# Patient Record
Sex: Male | Born: 1960 | Race: Black or African American | Hispanic: No | Marital: Single | Smoking: Current every day smoker
Health system: Southern US, Community
[De-identification: ages and names within clinical notes are randomized; demographics above are authoritative.]

---

## 1999-06-16 ENCOUNTER — Emergency Department (HOSPITAL_COMMUNITY): Admission: EM | Admit: 1999-06-16 | Discharge: 1999-06-16 | Payer: Self-pay | Admitting: Emergency Medicine

## 1999-07-22 ENCOUNTER — Emergency Department (HOSPITAL_COMMUNITY): Admission: EM | Admit: 1999-07-22 | Discharge: 1999-07-22 | Payer: Self-pay | Admitting: Emergency Medicine

## 2002-05-19 ENCOUNTER — Emergency Department (HOSPITAL_COMMUNITY): Admission: EM | Admit: 2002-05-19 | Discharge: 2002-05-19 | Payer: Self-pay

## 2003-12-30 ENCOUNTER — Emergency Department (HOSPITAL_COMMUNITY): Admission: EM | Admit: 2003-12-30 | Discharge: 2003-12-30 | Payer: Self-pay | Admitting: *Deleted

## 2004-01-07 ENCOUNTER — Emergency Department (HOSPITAL_COMMUNITY): Admission: EM | Admit: 2004-01-07 | Discharge: 2004-01-07 | Payer: Self-pay | Admitting: Family Medicine

## 2004-06-12 ENCOUNTER — Ambulatory Visit: Payer: Self-pay | Admitting: Internal Medicine

## 2004-06-18 ENCOUNTER — Emergency Department (HOSPITAL_COMMUNITY): Admission: EM | Admit: 2004-06-18 | Discharge: 2004-06-18 | Payer: Self-pay | Admitting: Emergency Medicine

## 2005-03-28 ENCOUNTER — Ambulatory Visit: Payer: Self-pay | Admitting: Internal Medicine

## 2005-05-07 ENCOUNTER — Ambulatory Visit: Payer: Self-pay | Admitting: Internal Medicine

## 2005-06-11 ENCOUNTER — Ambulatory Visit: Payer: Self-pay | Admitting: Internal Medicine

## 2005-06-25 ENCOUNTER — Ambulatory Visit (HOSPITAL_COMMUNITY): Admission: RE | Admit: 2005-06-25 | Discharge: 2005-06-25 | Payer: Self-pay | Admitting: Internal Medicine

## 2005-06-25 ENCOUNTER — Encounter (INDEPENDENT_AMBULATORY_CARE_PROVIDER_SITE_OTHER): Payer: Self-pay | Admitting: Specialist

## 2005-06-28 ENCOUNTER — Ambulatory Visit: Payer: Self-pay | Admitting: Internal Medicine

## 2005-07-02 ENCOUNTER — Emergency Department (HOSPITAL_COMMUNITY): Admission: EM | Admit: 2005-07-02 | Discharge: 2005-07-02 | Payer: Self-pay | Admitting: Emergency Medicine

## 2005-07-02 ENCOUNTER — Ambulatory Visit: Payer: Self-pay | Admitting: Internal Medicine

## 2005-10-22 ENCOUNTER — Ambulatory Visit: Payer: Self-pay | Admitting: Internal Medicine

## 2006-04-15 ENCOUNTER — Ambulatory Visit: Payer: Self-pay | Admitting: Internal Medicine

## 2006-05-13 ENCOUNTER — Ambulatory Visit: Payer: Self-pay | Admitting: Pulmonary Disease

## 2006-06-16 ENCOUNTER — Ambulatory Visit (HOSPITAL_BASED_OUTPATIENT_CLINIC_OR_DEPARTMENT_OTHER): Admission: RE | Admit: 2006-06-16 | Discharge: 2006-06-16 | Payer: Self-pay | Admitting: Pulmonary Disease

## 2006-06-16 ENCOUNTER — Ambulatory Visit: Payer: Self-pay | Admitting: Pulmonary Disease

## 2006-10-11 ENCOUNTER — Encounter: Payer: Self-pay | Admitting: Internal Medicine

## 2006-10-11 DIAGNOSIS — F528 Other sexual dysfunction not due to a substance or known physiological condition: Secondary | ICD-10-CM

## 2006-10-11 DIAGNOSIS — F1021 Alcohol dependence, in remission: Secondary | ICD-10-CM

## 2006-10-11 DIAGNOSIS — K603 Anal fistula: Secondary | ICD-10-CM

## 2006-12-09 ENCOUNTER — Ambulatory Visit: Payer: Self-pay | Admitting: Internal Medicine

## 2006-12-09 ENCOUNTER — Encounter: Payer: Self-pay | Admitting: Internal Medicine

## 2006-12-09 DIAGNOSIS — G4733 Obstructive sleep apnea (adult) (pediatric): Secondary | ICD-10-CM | POA: Insufficient documentation

## 2006-12-09 LAB — CONVERTED CEMR LAB
ALT: 22 units/L (ref 0–53)
AST: 17 units/L (ref 0–37)
Albumin: 3.9 g/dL (ref 3.5–5.2)
Alkaline Phosphatase: 50 units/L (ref 39–117)
BUN: 10 mg/dL (ref 6–23)
Basophils Absolute: 0 10*3/uL (ref 0.0–0.1)
Basophils Relative: 0.3 % (ref 0.0–1.0)
Bilirubin Urine: NEGATIVE
Bilirubin, Direct: 0.1 mg/dL (ref 0.0–0.3)
CO2: 31 meq/L (ref 19–32)
Calcium: 9.5 mg/dL (ref 8.4–10.5)
Chloride: 108 meq/L (ref 96–112)
Cholesterol: 152 mg/dL (ref 0–200)
Creatinine, Ser: 1 mg/dL (ref 0.4–1.5)
Eosinophils Absolute: 0.2 10*3/uL (ref 0.0–0.6)
Eosinophils Relative: 2.4 % (ref 0.0–5.0)
GFR calc Af Amer: 104 mL/min
GFR calc non Af Amer: 86 mL/min
Glucose, Bld: 113 mg/dL — ABNORMAL HIGH (ref 70–99)
HCT: 45.2 % (ref 39.0–52.0)
HDL: 34.3 mg/dL — ABNORMAL LOW (ref >39.0)
Hemoglobin: 14.8 g/dL (ref 13.0–17.0)
Ketones, ur: NEGATIVE mg/dL
LDL Cholesterol: 84 mg/dL (ref 0–99)
Leukocytes, UA: NEGATIVE
Lymphocytes Relative: 37.7 % (ref 12.0–46.0)
MCHC: 32.7 g/dL (ref 30.0–36.0)
MCV: 78.2 fL (ref 78.0–100.0)
Monocytes Absolute: 0.5 10*3/uL (ref 0.2–0.7)
Monocytes Relative: 6 % (ref 3.0–11.0)
Neutro Abs: 5 10*3/uL (ref 1.4–7.7)
Neutrophils Relative %: 53.6 % (ref 43.0–77.0)
Nitrite: NEGATIVE
PSA: 1.17 ng/mL (ref 0.10–4.00)
Platelets: 224 10*3/uL (ref 150–400)
Potassium: 4.8 meq/L (ref 3.5–5.1)
RBC: 5.78 M/uL (ref 4.22–5.81)
RDW: 15.5 % — ABNORMAL HIGH (ref 11.5–14.6)
Sodium: 144 meq/L (ref 135–145)
Specific Gravity, Urine: 1.02 (ref 1.000–1.03)
TSH: 0.67 microintl units/mL (ref 0.35–5.50)
Total Bilirubin: 0.6 mg/dL (ref 0.3–1.2)
Total CHOL/HDL Ratio: 4.4
Total Protein, Urine: NEGATIVE mg/dL
Total Protein: 7.1 g/dL (ref 6.0–8.3)
Triglycerides: 168 mg/dL — ABNORMAL HIGH (ref 0–149)
Urine Glucose: NEGATIVE mg/dL
Urobilinogen, UA: 0.2 (ref 0.0–1.0)
VLDL: 34 mg/dL (ref 0–40)
WBC: 9.1 10*3/uL (ref 4.5–10.5)
pH: 6 (ref 5.0–8.0)

## 2006-12-13 ENCOUNTER — Ambulatory Visit: Payer: Self-pay | Admitting: Internal Medicine

## 2006-12-13 ENCOUNTER — Telehealth: Payer: Self-pay | Admitting: Internal Medicine

## 2006-12-13 LAB — CONVERTED CEMR LAB: HIV: REACTIVE

## 2006-12-16 LAB — CONVERTED CEMR LAB

## 2006-12-19 ENCOUNTER — Telehealth (INDEPENDENT_AMBULATORY_CARE_PROVIDER_SITE_OTHER): Payer: Self-pay | Admitting: *Deleted

## 2007-01-17 ENCOUNTER — Ambulatory Visit: Payer: Self-pay | Admitting: Internal Medicine

## 2007-01-18 LAB — CONVERTED CEMR LAB

## 2007-01-22 ENCOUNTER — Telehealth (INDEPENDENT_AMBULATORY_CARE_PROVIDER_SITE_OTHER): Payer: Self-pay | Admitting: *Deleted

## 2007-10-27 ENCOUNTER — Ambulatory Visit: Payer: Self-pay | Admitting: Internal Medicine

## 2007-10-27 DIAGNOSIS — R030 Elevated blood-pressure reading, without diagnosis of hypertension: Secondary | ICD-10-CM | POA: Insufficient documentation

## 2007-10-27 DIAGNOSIS — H60339 Swimmer's ear, unspecified ear: Secondary | ICD-10-CM | POA: Insufficient documentation

## 2008-01-12 ENCOUNTER — Ambulatory Visit: Payer: Self-pay | Admitting: Endocrinology

## 2010-06-30 NOTE — Assessment & Plan Note (Signed)
[facility]                           GASTROENTEROLOGY OFFICE NOTE   NAME:Bartlett, Eric MUZYKA                   MRN:          [id number]  DATE:10/22/2005                            DOB:          Dec 08, 1960    REASON FOR VISIT/CHIEF COMPLAINT:  Recurrent rectal pain.   ASSESSMENT:  1. Recurrent anal fissure, much improved with stool softeners.  2. Morbid obesity with snoring, he is at risk for sleep apnea though this      is not proven.  3. Prior colonoscopy Jun 25, 2005, demonstrating possible ulceration,      pathology showed focal erosion, I thought this was probably from scope      trauma.   RECOMMENDATIONS:  1. Nitroglycerin 0.2% ointment is prescribed for the patient to use on his      recurrent fissure symptoms.  He is actually better with conservative      therapy.  2. If persistent problems, we could consider a Botox injection, he saw Dr.      Jamey Ripa who thought that his morbid obesity and risk for sleep apnea      warranted formal sleep apnea evaluation before any general anesthesia      for fissure treatment.  Fortunately, he is better with conservative      therapy.  Note, I had tried Diltiazem therapy and it sounds like the      nitroglycerin works better and he seems to be adapting to any headache.   He should follow up with Dr. Jonny Ruiz regarding general medical care.  He is  trying to lose weight and is commended.  A sleep apnea evaluation might be  reasonable.  He does have a fair amount of snoring, although I do not get a  sense for fatigue or headaches during the day.  He is certainly at risk for  this.  I will see him back as needed at this point.  Clearly if he has  persistent symptoms, we will not want to refill his medications without an  evaluation.  On rectal exam today, he was really nontender around the anus,  digital exam was not completely performed, however.  See my medical history  and physical for full details.                                   Iva Boop, MD,FACG   CEG/MedQ  DD:  10/22/2005  DT:  10/23/2005  Job #:  [id number]   cc:   Currie Paris, M.D.  Dr. Jonny Ruiz

## 2010-06-30 NOTE — Assessment & Plan Note (Signed)
[facility]                             PULMONARY OFFICE NOTE   NAME:Eric Bartlett, Eric Bartlett                   MRN:          [id number]  DATE:05/13/2006                            DOB:          08-07-1960    Eric Bartlett was seen today for evaluation of his sleep difficulties.   He works as a Naval architect and during his evaluation by the Department  of Transportation due to his weight and his neck size, there was concern  that he may have sleep disorder breathing and as a result is he referred  for further evaluation.  He says that he is on the road Monday through  Friday and his driving hours can vary, although he says he will always  allow himself at least 7 to 8 hours of rest time.  However, when he is  at home, on weekends he will typically go to bed about 1 in the morning,  he wakes up at 6 in the morning.  He says he is a fairly restless  sleeper, he has been told that he snores and has been told he has stops  breathing while he is asleep.  He will sometimes wake up hearing himself  snore.  He has difficulty sleeping on his back. He does drool at night  and he says that he rarely has any dreams.  He has had episodes where he  has woke up with a headache.  He denies any symptoms of restless leg  syndrome, there is no history of sleep hallucinations, sleep paralysis,  cataplexy.  He denies any history of sleep walking, sleep talking or  bruxism.  He is not currently using anything to help him fall asleep at  night.  He says he will drink an energy drink when he has longer drives,  otherwise he drinks 2 to 3 large cups of coffee on a regular basis.  He  says he can fall asleep when he is watching TV, depending upon what is  on.  He says he has not had any problems as far as getting into any  accidents while driving.   His past medical history otherwise is significant for erectile  dysfunction and anal fissure.   CURRENT MEDICATIONS:  Stool softener and  fiber supplement.   He has no known drug allergies.   SOCIAL HISTORY:  He is separated.  He works with [employer].  He smokes 1 pack of cigarettes a day for the last 20 years.  He quit drinking 10-1/2 years ago.  He quit using drugs 10-1/2 years  ago.   FAMILY HISTORY:  Is significant for his father who is an alcoholic and  his brother who had drug abuse and schizophrenia.   REVIEW OF SYSTEMS:  He has gained approximately 10 pounds over about 2  years and he has leg swelling at the end of the day.   PHYSICAL EXAMINATION:  He is 6 feet 4 inches tall, 350 pounds.  Temperature is 98.  Blood pressure is 160/110.  Heart rate 77.  Oxygen  saturation 99% on room air.  HEENT:  Pupils are reactive. There is no sinus tenderness, no nasal  discharge.  He has a Mallampati 4 airway. There is no lymphadenopathy.  HEART:  With S1, S2.  CHEST:  Was clear on auscultation.  ABDOMEN:  Was obese, soft, nontender.  EXTREMITIES:  No edema, cyanosis, clubbing.  NEURO:  No focal deficits were appreciated.   IMPRESSION:  1. He certainly has symptoms on physical findings which are concerning      for obstructive sleep apnea. I reviewed this diagnosis with him.  I      had also discussed with him the adverse consequences of untreated      sleep apnea, including increased risk of hypertension, coronary      artery disease, cerebrovascular disease and diabetes.  I discussed      with him the importance of diet, exercise, weight reduction as well      as the avoidance of alcohol and sedatives.  Driving precautions      were discussed with him that if the fact he is found to have sleep      disordered breathing, this would have impact on his occupation.  To      further evaluate this I have scheduled him for an overnight      polysomnogram, then I would discuss the results with him once they      are available.  2. Elevated blood pressure in the office.  He is to have follow-up       with Dr. Jonny Ruiz for further evaluation of this.  3. Tobacco use.  I have discussed with him several techniques with      regards to smoking cessation and he is to have further evaluation      of this with Dr. Jonny Ruiz.  4. Morbid obesity.  I again have discussed with him the importance of      diet and exercise and weight reduction and he will also have follow      up with Dr. Jonny Ruiz for this.     Coralyn Helling, MD  Electronically Signed    VS/MedQ  DD: 05/13/2006  DT: 05/13/2006  Job #: [id number]   cc:   Corwin Levins, MD

## 2010-06-30 NOTE — Procedures (Signed)
NAME:  Eric Bartlett, Eric Bartlett NO.:  [id number]   MEDICAL RECORD NO.:  [id number]          PATIENT TYPE:  OUT   LOCATION:  SLEEP CENTER                 FACILITY:  [facility]   PHYSICIAN:  Coralyn Helling, MD        DATE OF BIRTH:  1960/06/29   DATE OF STUDY:  06/16/2006                            NOCTURNAL POLYSOMNOGRAM   REFERRING PHYSICIAN:  Coralyn Helling, MD   FACILITY:  [facility].   INDICATION:  This is an individual who has a history of excessive  daytime sleepiness with sleep disruption, and during evaluation by the  Department of Transportation, it was recommended he undergo further  evaluation for hypersomnia with obstructive sleep apnea.   EPWORTH SCORE:  The Epworth score is 4.   MEDICATIONS:  None.   SLEEP ARCHITECTURE:  Total recording time was 403 minutes.  Total sleep  time was 355 minutes.  Sleep efficiency was 88%.  Sleep latency was 11.5  minutes.  REM latency was 119 minutes.  This study was notable for the  lack of slow wave sleep and an increase in the percentage of REM sleep  to 37% of the study.  The patient slept both in the supine and non-  supine position.   RESPIRATORY DATA:  The average respiratory rate was 24.  The patient  followed the split night study protocol.  During the diagnostic portion  of the study, the apnea-hypopnea index was found to be 63.  The events  were exclusively obstructive in nature, and loud snoring was noted by  the technician.  During the therapeutic portion of the study, the  patient was titrated from a CPAP pressure setting of 6 to 16 cm H2O.  At  a CPAP pressure setting of 15 cm H2O, the apnea-hypopnea index was  reduced to 4.4.  At this pressure setting, the patient was observed in  both REM sleep and supine sleep, and snoring was eliminated.   OXYGEN DATA:  The baseline oxygenation was 96%.  The oxygen saturation  nadir was 79%.  At a CPAP pressure setting of 15 cm H2O, the mean  oxygenation during non-REM  sleep was 95%, the mean oxygenation during  REM sleep was 95%, the minimal oxygenation during non-REM sleep was 92%,  and the minimal oxygenation during REM sleep was 92%.   CARDIAC DATA:  The average heart rate as 71, and the rhythm strip showed  normal sinus rhythm.   MOVEMENT PARASOMNIA:  The periodic limb movement index was 0.   IMPRESSION:  This study shows evidence for severe obstructive sleep  apnea as demonstrated by an apnea-hypopnea index of 63 and an oxygen  saturation nadir of 79%.  During the therapeutic portion of the study,  he was  titrated to a CPAP pressure setting of 15 cm H2O with a reduction in his  apnea-hypopnea index to 4.4.  At this pressure setting, snoring was  eliminated, and he was observed in both REM sleep and supine sleep.      Coralyn Helling, MD  Diplomat, American Board of Sleep Medicine  Electronically Signed     VS/MEDQ  D:  06/18/2006 09:05:26  T:  06/18/2006 10:08:05  Job:  [id number]

## 2012-11-09 ENCOUNTER — Encounter (HOSPITAL_COMMUNITY): Payer: Self-pay | Admitting: Emergency Medicine

## 2012-11-09 ENCOUNTER — Emergency Department (HOSPITAL_COMMUNITY)
Admission: EM | Admit: 2012-11-09 | Discharge: 2012-11-09 | Disposition: A | Discharge status: Home / Self Care | Payer: Self-pay | Attending: Emergency Medicine | Admitting: Emergency Medicine

## 2012-11-09 DIAGNOSIS — F17200 Nicotine dependence, unspecified, uncomplicated: Secondary | ICD-10-CM | POA: Insufficient documentation

## 2012-11-09 DIAGNOSIS — H9190 Unspecified hearing loss, unspecified ear: Secondary | ICD-10-CM | POA: Insufficient documentation

## 2012-11-09 DIAGNOSIS — H6092 Unspecified otitis externa, left ear: Secondary | ICD-10-CM

## 2012-11-09 DIAGNOSIS — H9210 Otorrhea, unspecified ear: Secondary | ICD-10-CM | POA: Insufficient documentation

## 2012-11-09 DIAGNOSIS — H60399 Other infective otitis externa, unspecified ear: Secondary | ICD-10-CM | POA: Insufficient documentation

## 2012-11-09 DIAGNOSIS — R739 Hyperglycemia, unspecified: Secondary | ICD-10-CM

## 2012-11-09 DIAGNOSIS — R7309 Other abnormal glucose: Secondary | ICD-10-CM | POA: Insufficient documentation

## 2012-11-09 LAB — GLUCOSE, CAPILLARY: Glucose-Capillary: 112 mg/dL — ABNORMAL HIGH (ref 70–99)

## 2012-11-09 MED ORDER — HYDROCODONE-ACETAMINOPHEN 5-325 MG PO TABS: 1.0000 | ORAL_TABLET | ORAL | Status: DC | PRN

## 2012-11-09 MED ORDER — CIPROFLOXACIN-DEXAMETHASONE 0.3-0.1 % OT SUSP
4.0000 [drp] | Freq: Two times a day (BID) | OTIC | Status: AC
  Administered 2012-11-09: 4 [drp] via OTIC
  Filled 2012-11-09: qty 7.5

## 2012-11-09 NOTE — ED Provider Notes (Signed)
CSN: [id number]     Arrival date & time 11/09/12  4098 History   First MD Initiated Contact with Patient 11/09/12 [phone]     Chief Complaint  Patient presents with  . left ear pain    (Consider location/radiation/quality/duration/timing/severity/associated sxs/prior Treatment) Patient is a 52 y.o. male presenting with ear pain. The history is provided by the patient.  Otalgia Location:  Left Quality:  Throbbing, pressure and sore Severity:  Severe Onset quality:  Gradual Duration:  1 day Timing:  Constant Progression:  Worsening Chronicity:  Recurrent Relieved by:  None tried Worsened by:  Nothing tried Associated symptoms: ear discharge   Associated symptoms: no abdominal pain, no congestion, no cough, no diarrhea, no fever, no headaches, no neck pain, no rash, no rhinorrhea, no sore throat and no vomiting  Hearing loss: decreased hearing.       History reviewed. No pertinent past medical history. History reviewed. No pertinent past surgical history. No family history on file. History  Substance Use Topics  . Smoking status: Current Every Day Smoker  . Smokeless tobacco: Not on file  . Alcohol Use: No    Review of Systems  Constitutional: Negative for fever.  HENT: Positive for ear pain and ear discharge. Negative for congestion, sore throat, rhinorrhea, neck pain and neck stiffness. Hearing loss: decreased hearing.   Respiratory: Negative for cough.   Gastrointestinal: Negative for vomiting, abdominal pain and diarrhea.  Skin: Negative for rash.  Neurological: Negative for headaches.    Allergies  Review of patient's allergies indicates no known allergies.  Home Medications   Current Outpatient Rx  Name  Route  Sig  Dispense  Refill  . ibuprofen (ADVIL,MOTRIN) 200 MG tablet   Oral   Take 800 mg by mouth every 8 (eight) hours as needed for pain.         . sildenafil (VIAGRA) 100 MG tablet   Oral   Take 100 mg by mouth daily as needed for erectile  dysfunction.          BP 145/95  Pulse 63  Temp(Src) 98.3 F (36.8 C) (Oral)  Resp 18  SpO2 100% Physical Exam  Nursing note and vitals reviewed. Constitutional: He appears well-developed and well-nourished. No distress.  HENT:  Head: Normocephalic and atraumatic.  Right Ear: Hearing, tympanic membrane and ear canal normal. No drainage, swelling or tenderness. No mastoid tenderness. No decreased hearing is noted.  Left Ear: There is drainage, swelling and tenderness. No foreign bodies. No mastoid tenderness. Decreased hearing is noted.  Eyes: Conjunctivae are normal. No scleral icterus.  Neck: Normal range of motion. Neck supple.  Cardiovascular: Normal rate, regular rhythm and normal heart sounds.   Pulmonary/Chest: Effort normal and breath sounds normal. No respiratory distress.  Abdominal: Soft. There is no tenderness.  Musculoskeletal: He exhibits no edema.  Neurological: He is alert.  Skin: Skin is warm and dry. He is not diaphoretic.  Psychiatric: His behavior is normal.    ED Course  Procedures (including critical care time) Labs Review Labs Reviewed - No data to display Imaging Review No results found.  MDM  No diagnosis found. Patient with Otitis externa.  Draining No exposure to water. Last ear infections was 20 years ago. Patient is obeses and uninsured. Will check his CBG. Ear wick placed and ciprodex ordered.   CBG shows mild hyperglycemia  F/u w/ pcp and ENT   Arthor Captain, PA-C 11/09/12 1107

## 2012-11-09 NOTE — ED Notes (Signed)
Per pt, left ear pain since Friday-increased pain yesterday-slight drainage last night

## 2012-11-09 NOTE — ED Provider Notes (Signed)
Medical screening examination/treatment/procedure(s) were performed by non-physician practitioner and as supervising physician I was immediately available for consultation/collaboration.   Chelesea Weiand T Antoneo Ghrist, MD 11/09/12 1643 

## 2014-09-04 ENCOUNTER — Encounter (HOSPITAL_COMMUNITY): Payer: Self-pay | Admitting: Emergency Medicine

## 2014-09-04 ENCOUNTER — Emergency Department (HOSPITAL_COMMUNITY)
Admission: EM | Admit: 2014-09-04 | Discharge: 2014-09-04 | Disposition: A | Discharge status: Home / Self Care | Payer: Self-pay | Attending: Emergency Medicine | Admitting: Emergency Medicine

## 2014-09-04 DIAGNOSIS — Z720 Tobacco use: Secondary | ICD-10-CM | POA: Insufficient documentation

## 2014-09-04 DIAGNOSIS — K047 Periapical abscess without sinus: Secondary | ICD-10-CM

## 2014-09-04 DIAGNOSIS — K029 Dental caries, unspecified: Secondary | ICD-10-CM

## 2014-09-04 MED ORDER — AMOXICILLIN 500 MG PO CAPS: 500.0000 mg | ORAL_CAPSULE | Freq: Three times a day (TID) | ORAL | Status: DC

## 2014-09-04 MED ORDER — AMOXICILLIN 500 MG PO CAPS
500.0000 mg | ORAL_CAPSULE | Freq: Once | ORAL | Status: AC
  Administered 2014-09-04: 500 mg via ORAL
  Filled 2014-09-04: qty 1

## 2014-09-04 NOTE — ED Notes (Signed)
Patient here with complaint of right lower dental pain. States onset 2 days ago. Denies fever. Face appear symmetric.

## 2014-09-04 NOTE — ED Provider Notes (Signed)
CSN: [id number]     Arrival date & time 09/04/14  2045 History  This chart was scribed for non-physician practitioner Kerrie Buffalo, NP working with Arby Barrette, MD by Lyndel Safe, ED Scribe. This patient was seen in room TR08C/TR08C and the patient's care was started at 9:20 PM.   Chief Complaint  Patient presents with  . Dental Pain   Patient is a 54 y.o. male presenting with tooth pain. The history is provided by the patient. No language interpreter was used.  Dental Pain Location:  Lower (right) Severity:  Moderate Onset quality:  Sudden Duration:  2 days Timing:  Constant Progression:  Worsening Chronicity:  New Associated symptoms: no fever    HPI Comments: Eric Bartlett is a 54 y.o. male who presents to the Emergency Department complaining of progressively worsening, constant, right, lower dental pain and swelling onset 2 days. Pt reports he believes he has an abscess in his right, lower oral region. He has been taking OTC pain medication with mild to no relief. Denies fever, drainage from the area, otalgia, or sore throat. NKDA. Patient reports that he thinks he needs antibiotics for an abscessed tooth. Patient with hx of HTN in the past but does not take medication.   History reviewed. No pertinent past medical history. History reviewed. No pertinent past surgical history. History reviewed. No pertinent family history. History  Substance Use Topics  . Smoking status: Current Every Day Smoker  . Smokeless tobacco: Not on file  . Alcohol Use: No    Review of Systems  Constitutional: Negative for fever.  HENT: Positive for dental problem. Negative for ear pain and sore throat.   All other systems reviewed and are negative.   Allergies  Review of patient's allergies indicates no known allergies.  Home Medications   Prior to Admission medications   Medication Sig Start Date End Date Taking? Authorizing Provider  amoxicillin (AMOXIL) 500 MG capsule Take 1  capsule (500 mg total) by mouth 3 (three) times daily. 09/04/14   Hope Orlene Och, NP  HYDROcodone-acetaminophen (NORCO) 5-325 MG per tablet Take 1 tablet by mouth every 4 (four) hours as needed for pain. 11/09/12   Arthor Captain, PA-C  ibuprofen (ADVIL,MOTRIN) 200 MG tablet Take 800 mg by mouth every 8 (eight) hours as needed for pain.    Historical Provider, MD  sildenafil (VIAGRA) 100 MG tablet Take 100 mg by mouth daily as needed for erectile dysfunction.    Historical Provider, MD   BP 151/112 mmHg  Pulse 98  Temp(Src) 97.8 F (36.6 C) (Oral)  Resp 20  Ht 6\' 4"  (1.93 m)  Wt 350 lb (158.759 kg)  BMI 42.62 kg/m2  SpO2 99% Physical Exam  Constitutional: He is oriented to person, place, and time. He appears well-developed and well-nourished.  HENT:  Head: Normocephalic and atraumatic.  Right Ear: Tympanic membrane and external ear normal.  Left Ear: Tympanic membrane and external ear normal.  Nose: Nose normal.  Mouth/Throat: Uvula is midline, oropharynx is clear and moist and mucous membranes are normal. No oropharyngeal exudate.    Mild facial swelling; dental carie to 12th tooth on lower right side.  Tender on exam. Swelling of the gum surrounding the tooth.   Eyes: Conjunctivae and EOM are normal.  Neck: Neck supple.  Cardiovascular: Normal rate.   Pulmonary/Chest: Effort normal.  Musculoskeletal: Normal range of motion.  Lymphadenopathy:    He has cervical adenopathy.  Neurological: He is alert and oriented to person, place, and time.  No cranial nerve deficit.  Skin: Skin is warm and dry.  Psychiatric: He has a normal mood and affect. His behavior is normal.  Nursing note and vitals reviewed.   ED Course  Procedures  DIAGNOSTIC STUDIES: Oxygen Saturation is 99% on RA, normal by my interpretation.    COORDINATION OF CARE: 9:23 PM Discussed treatment plan which includes to order and prescribe amoxicillin with pt. Advised pt to use ibuprofen, Orajel, and gargle with a  saline solution. Will give referral to dentistry. Pt acknowledges and agrees to plan.   Discussed in detail with the patient elevated BP and need for follow up. He states he will go to the health food store and get what he needs for his BP like he did in the past. Discussed complications associated with HTN. Patient voices understanding.  MDM  55 y.o. male with dental pain due to abscess and caries. Will treat with antibiotics and he will follow up with a dentist as soon as possible. .  Final diagnoses:  Dental abscess  Dental caries    I personally performed the services described in this documentation, which was scribed in my presence. The recorded information has been reviewed and is accurate.   [address] [place], [place] 09/05/14 1610  Arby Barrette, MD 09/12/14 [phone]

## 2014-09-04 NOTE — ED Notes (Signed)
Pt left with all belongings and ambulated out of treatment area.  

## 2015-11-16 ENCOUNTER — Emergency Department (HOSPITAL_COMMUNITY): Payer: No Typology Code available for payment source

## 2015-11-16 ENCOUNTER — Encounter (HOSPITAL_COMMUNITY): Payer: Self-pay | Admitting: Emergency Medicine

## 2015-11-16 DIAGNOSIS — S6992XA Unspecified injury of left wrist, hand and finger(s), initial encounter: Secondary | ICD-10-CM | POA: Diagnosis present

## 2015-11-16 DIAGNOSIS — S52132A Displaced fracture of neck of left radius, initial encounter for closed fracture: Secondary | ICD-10-CM | POA: Insufficient documentation

## 2015-11-16 DIAGNOSIS — Y92410 Unspecified street and highway as the place of occurrence of the external cause: Secondary | ICD-10-CM | POA: Diagnosis not present

## 2015-11-16 DIAGNOSIS — F17200 Nicotine dependence, unspecified, uncomplicated: Secondary | ICD-10-CM | POA: Diagnosis not present

## 2015-11-16 DIAGNOSIS — Y939 Activity, unspecified: Secondary | ICD-10-CM | POA: Insufficient documentation

## 2015-11-16 DIAGNOSIS — Y999 Unspecified external cause status: Secondary | ICD-10-CM | POA: Insufficient documentation
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
## 2015-11-17 ENCOUNTER — Emergency Department (HOSPITAL_COMMUNITY)
Admission: EM | Admit: 2015-11-17 | Discharge: 2015-11-17 | Disposition: A | Discharge status: Home / Self Care | Payer: No Typology Code available for payment source | Attending: Emergency Medicine | Admitting: Emergency Medicine

## 2015-11-17 ENCOUNTER — Emergency Department (HOSPITAL_COMMUNITY): Payer: No Typology Code available for payment source

## 2015-11-17 DIAGNOSIS — S52502A Unspecified fracture of the lower end of left radius, initial encounter for closed fracture: Secondary | ICD-10-CM

## 2015-11-17 DIAGNOSIS — S52602A Unspecified fracture of lower end of left ulna, initial encounter for closed fracture: Secondary | ICD-10-CM

## 2015-11-17 MED ORDER — PROPOFOL 10 MG/ML IV BOLUS
INTRAVENOUS | Status: AC | PRN
  Administered 2015-11-17 (×3): 40 mg via INTRAVENOUS

## 2015-11-17 MED ORDER — PROPOFOL 10 MG/ML IV BOLUS
0.5000 mg/kg | Freq: Once | INTRAVENOUS | Status: DC
  Filled 2015-11-17: qty 20

## 2015-11-17 MED ORDER — OXYCODONE-ACETAMINOPHEN 5-325 MG PO TABS
1.0000 | ORAL_TABLET | Freq: Once | ORAL | Status: AC
  Administered 2015-11-17: 1 via ORAL
  Filled 2015-11-17: qty 1

## 2015-11-17 MED ORDER — LIDOCAINE HCL (PF) 1 % IJ SOLN
5.0000 mL | Freq: Once | INTRAMUSCULAR | Status: AC
  Administered 2015-11-17: 5 mL via INTRADERMAL
  Filled 2015-11-17: qty 5

## 2015-11-17 MED ORDER — HYDROCODONE-ACETAMINOPHEN 5-325 MG PO TABS: 1.0000 | ORAL_TABLET | ORAL | 0 refills | Status: DC | PRN

## 2015-11-17 NOTE — Discharge Instructions (Signed)
As discussed, with your wrist fracture in this very important that you follow-up with our hand surgeon for additional evaluation.  Return here for concerning changes in your condition.

## 2015-11-17 NOTE — Progress Notes (Signed)
Orthopedic Tech Progress Note Patient Details:  Darol DestineChristopher V Fitterer 06-24-1960 [id number]  Ortho Devices Type of Ortho Device: Sugartong splint, Arm sling Ortho Device/Splint Location: lue Ortho Device/Splint Interventions: Ordered, Application   Trinna PostMartinez, Jamario Colina J 11/17/2015, 5:17 AM
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
## 2016-02-04 ENCOUNTER — Ambulatory Visit (INDEPENDENT_AMBULATORY_CARE_PROVIDER_SITE_OTHER): Payer: Self-pay | Admitting: Physician Assistant

## 2016-02-04 VITALS — BP 140/84 | HR 94 | Temp 98.2°F | Resp 16 | Ht 75.0 in | Wt 334.0 lb

## 2016-02-04 DIAGNOSIS — Z024 Encounter for examination for driving license: Secondary | ICD-10-CM

## 2016-02-04 NOTE — Patient Instructions (Signed)
     IF you received an x-ray today, you will receive an invoice from [facility]. Please contact [facility] at [phone] with questions or concerns regarding your invoice.   IF you received labwork today, you will receive an invoice from [facility]. Please contact [facility] at [phone] with questions or concerns regarding your invoice.   Our billing staff will not be able to assist you with questions regarding bills from these companies.  You will be contacted with the lab results as soon as they are available. The fastest way to get your results is to activate your My Chart account. Instructions are located on the last page of this paperwork. If you have not heard from us regarding the results in 2 weeks, please contact this office.     
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
## 2018-10-28 ENCOUNTER — Emergency Department (HOSPITAL_COMMUNITY)
Admission: EM | Admit: 2018-10-28 | Discharge: 2018-10-28 | Disposition: A | Discharge status: Home / Self Care | Payer: Self-pay | Attending: Emergency Medicine | Admitting: Emergency Medicine

## 2018-10-28 ENCOUNTER — Other Ambulatory Visit: Payer: Self-pay

## 2018-10-28 ENCOUNTER — Encounter (HOSPITAL_COMMUNITY): Payer: Self-pay | Admitting: *Deleted

## 2018-10-28 DIAGNOSIS — K0889 Other specified disorders of teeth and supporting structures: Secondary | ICD-10-CM

## 2018-10-28 DIAGNOSIS — F17200 Nicotine dependence, unspecified, uncomplicated: Secondary | ICD-10-CM | POA: Insufficient documentation

## 2018-10-28 DIAGNOSIS — K029 Dental caries, unspecified: Secondary | ICD-10-CM | POA: Insufficient documentation

## 2018-10-28 MED ORDER — PENICILLIN V POTASSIUM 500 MG PO TABS: 500.0000 mg | ORAL_TABLET | Freq: Four times a day (QID) | ORAL | 0 refills | Status: AC

## 2018-10-28 NOTE — ED Provider Notes (Signed)
[facility] EMERGENCY DEPARTMENT Provider Note   CSN: [id number] Arrival date & time: 10/28/18  0207     History   Chief Complaint Chief Complaint  Patient presents with  . Dental Pain    HPI Eric Bartlett is a 58 y.o. male.     HPI  This a 58 year old male with a history of high blood pressure, obesity who presents with dental pain.  Patient reports 1 week history of worsening left upper dental pain.  He has been taking ibuprofen and states that he gets good relief with this.  He has had difficulty getting into a dentist.  He is hopeful that he can be referred for urgent evaluation.  He is currently symptom-free.  No difficulty swallowing.  No fevers.  No recent dental procedures.  He has called multiple dentist and has not been able to get in.  History reviewed. No pertinent past medical history.  Patient Active Problem List   Diagnosis Date Noted  . OTITIS EXTERNA, ACUTE, LEFT 10/27/2007  . ELEVATED BLOOD PRESSURE WITHOUT DIAGNOSIS OF HYPERTENSION 10/27/2007  . SLEEP APNEA, OBSTRUCTIVE 12/09/2006  . MORBID OBESITY 10/11/2006  . ERECTILE DYSFUNCTION 10/11/2006  . FISTULA, ANAL 10/11/2006  . POSITIVE PPD 10/11/2006  . HX, PERSONAL, ALCOHOLISM 10/11/2006    History reviewed. No pertinent surgical history.      Home Medications    Prior to Admission medications   Medication Sig Start Date End Date Taking? Authorizing Provider  penicillin v potassium (VEETID) 500 MG tablet Take 1 tablet (500 mg total) by mouth 4 (four) times daily for 10 days. 10/28/18 11/07/18  Merryl Hacker, MD    Family History No family history on file.  Social History Social History   Tobacco Use  . Smoking status: Current Every Day Smoker  . Smokeless tobacco: Never Used  Substance Use Topics  . Alcohol use: Yes    Comment: 2 beers per day  . Drug use: No     Allergies   Patient has no known allergies.   Review of Systems Review of Systems   Constitutional: Negative for fever.  HENT: Positive for dental problem. Negative for trouble swallowing.   Respiratory: Negative for shortness of breath.   Cardiovascular: Negative for chest pain.  All other systems reviewed and are negative.    Physical Exam Updated Vital Signs BP (!) 145/112 (BP Location: Right Arm)   Pulse 65   Temp 98.2 F (36.8 C) (Oral)   Resp 20   SpO2 100%   Physical Exam Vitals signs and nursing note reviewed.  Constitutional:      Appearance: He is well-developed.     Comments: Morbidly obese  HENT:     Head: Normocephalic and atraumatic.     Comments: Generally poor dentition, multiple missing teeth and dental caries, tenderness to palpation left upper gumline without palpable abscess, no trismus Eyes:     Pupils: Pupils are equal, round, and reactive to light.  Neck:     Musculoskeletal: Neck supple.  Cardiovascular:     Rate and Rhythm: Normal rate and regular rhythm.  Pulmonary:     Effort: Pulmonary effort is normal. No respiratory distress.     Breath sounds: Normal breath sounds.  Musculoskeletal:     Right lower leg: No edema.     Left lower leg: No edema.  Skin:    General: Skin is warm and dry.  Neurological:     Mental Status: He is alert and oriented to  person, place, and time.  Psychiatric:        Mood and Affect: Mood normal.      ED Treatments / Results  Labs (all labs ordered are listed, but only abnormal results are displayed) Labs Reviewed - No data to display  EKG None  Radiology No results found.  Procedures Procedures (including critical care time)  Medications Ordered in ED Medications - No data to display   Initial Impression / Assessment and Plan / ED Course  I have reviewed the triage vital signs and the nursing notes.  Pertinent labs & imaging results that were available during my care of the patient were reviewed by me and considered in my medical decision making (see chart for details).         Patient presents with dental pain.  He is nontoxic and currently symptom-free.  Reports good symptom control with ibuprofen.  Presumed dental infection.  No obvious drainable abscess or deep space infection.  Will start on penicillin.  He was given the the number for the dentist on-call as well as dental resources.  Continue ibuprofen for pain.  After history, exam, and medical workup I feel the patient has been appropriately medically screened and is safe for discharge home. Pertinent diagnoses were discussed with the patient. Patient was given return precautions.  Final Clinical Impressions(s) / ED Diagnoses   Final diagnoses:  Pain, dental    ED Discharge Orders         Ordered    penicillin v potassium (VEETID) 500 MG tablet  4 times daily     10/28/18 0240           , Mayer Maskerourtney F, MD 10/28/18 [phone]

## 2018-10-28 NOTE — ED Triage Notes (Signed)
Pt is here with left upper dental pain that has continued to have sharp pain.  He just wants a resolution

## 2018-10-28 NOTE — Discharge Instructions (Addendum)
You were seen today for dental pain.  Take antibiotics as prescribed.  Monitor for worsening swelling.  See dental resource guide for follow-up.
